# Patient Record
Sex: Female | Born: 1985 | Race: Black or African American | Hispanic: No | Marital: Married
Health system: Midwestern US, Community
[De-identification: ages and names within clinical notes are randomized; demographics above are authoritative.]

## PROBLEM LIST (undated history)

## (undated) ENCOUNTER — Inpatient Hospital Stay (HOSPITAL_COMMUNITY): Payer: Self-pay

## (undated) DIAGNOSIS — R87629 Unspecified abnormal cytological findings in specimens from vagina: Secondary | ICD-10-CM

## (undated) DIAGNOSIS — Z8619 Personal history of other infectious and parasitic diseases: Secondary | ICD-10-CM

## (undated) DIAGNOSIS — D649 Anemia, unspecified: Secondary | ICD-10-CM

## (undated) HISTORY — DX: Personal history of other infectious and parasitic diseases: Z86.19

## (undated) HISTORY — PX: DENTAL SURGERY: SHX609

## (undated) HISTORY — DX: Unspecified abnormal cytological findings in specimens from vagina: R87.629

---

## 2009-12-30 LAB — DRUG SCREEN UR - NO CONFIRM
ACETAMINOPHEN: NEGATIVE
AMPHETAMINES: NEGATIVE
BARBITURATES: NEGATIVE
BENZODIAZEPINES: NEGATIVE
COCAINE: NEGATIVE
METHADONE: NEGATIVE
Methamphetamines: NEGATIVE
OPIATES: NEGATIVE
PCP(PHENCYCLIDINE): NEGATIVE
THC (TH-CANNABINOL): NEGATIVE
TRICYCLICS: NEGATIVE

## 2009-12-30 LAB — URINALYSIS W/ RFLX MICROSCOPIC
Bilirubin: NEGATIVE
Glucose: NEGATIVE MG/DL
Ketone: NEGATIVE MG/DL
Leukocyte Esterase: NEGATIVE
Nitrites: NEGATIVE
Protein: NEGATIVE MG/DL
Specific gravity: <1.005 (ref 1.003–1.030)
Urobilinogen: 0.2 EU/DL (ref 0.2–1.0)
pH (UA): 6.5 (ref 5.0–8.0)

## 2009-12-30 LAB — METABOLIC PANEL, BASIC
Anion gap: 13 mmol/L (ref 5–15)
BUN/Creatinine ratio: 8 — ABNORMAL LOW (ref 12–20)
BUN: 6 MG/DL — ABNORMAL LOW (ref 7–18)
CO2: 23 MMOL/L (ref 21–32)
Calcium: 9.4 MG/DL (ref 8.4–10.4)
Chloride: 108 MMOL/L (ref 100–108)
Creatinine: 0.8 MG/DL (ref 0.6–1.3)
GFR est AA: >60 mL/min/{1.73_m2} (ref >60)
GFR est non-AA: >60 mL/min/{1.73_m2} (ref >60)
Glucose: 112 MG/DL — ABNORMAL HIGH (ref 74–99)
Potassium: 3.7 MMOL/L (ref 3.5–5.5)
Sodium: 144 MMOL/L (ref 136–145)

## 2009-12-30 LAB — CBC WITH AUTOMATED DIFF
ABS. BASOPHILS: 0.1 10*3/uL (ref 0.0–0.1)
ABS. EOSINOPHILS: 0 10*3/uL (ref 0.0–0.4)
ABS. LYMPHOCYTES: 1.7 10*3/uL (ref 0.9–3.6)
ABS. MONOCYTES: 0.3 10*3/uL (ref 0.05–1.2)
ABS. NEUTROPHILS: 4.7 10*3/uL (ref 1.8–8.0)
BASOPHILS: 1 % (ref 0–2)
EOSINOPHILS: 0 % (ref 0–5)
HCT: 39.9 % (ref 35.0–45.0)
HGB: 14 g/dL (ref 12.0–16.0)
LYMPHOCYTES: 25 % (ref 21–52)
MCH: 32.3 PG (ref 24.0–34.0)
MCHC: 35.1 g/dL (ref 31.0–37.0)
MCV: 92.1 FL (ref 74.0–97.0)
MONOCYTES: 5 % (ref 3–10)
MPV: 9.5 FL (ref 9.2–11.8)
NEUTROPHILS: 69 % (ref 40–73)
PLATELET: 373 10*3/uL (ref 135–420)
RBC: 4.33 M/uL (ref 4.20–5.30)
RDW: 12.8 % (ref 11.6–14.5)
WBC: 6.8 10*3/uL (ref 4.6–13.2)

## 2009-12-30 LAB — HCG URINE, QL: HCG urine, QL: NEGATIVE

## 2009-12-30 LAB — ETHYL ALCOHOL: ALCOHOL(ETHYL),SERUM: 430 MG/DL — CR (ref 0–3)

## 2009-12-30 LAB — URINE MICROSCOPIC ONLY
RBC: 4 /HPF (ref 0–5)
WBC: 0 /HPF (ref 0–4)

## 2009-12-30 LAB — ACETAMINOPHEN: Acetaminophen level: 0 ug/mL — ABNORMAL LOW (ref 10–30)

## 2009-12-30 LAB — SALICYLATE: Salicylate level: <2.8 MG/DL — ABNORMAL LOW (ref 2.8–20.0)

## 2009-12-31 LAB — GLUCOSE, POC: Glucose (POC): 115 mg/dL — ABNORMAL HIGH (ref 70–110)

## 2012-07-28 LAB — OB RESULTS CONSOLE RPR: RPR: NONREACTIVE

## 2012-07-28 LAB — OB RESULTS CONSOLE ABO/RH: RH TYPE: NEGATIVE

## 2012-07-28 LAB — OB RESULTS CONSOLE HIV ANTIBODY (ROUTINE TESTING): HIV: NONREACTIVE

## 2012-07-28 LAB — OB RESULTS CONSOLE GC/CHLAMYDIA
Chlamydia: NEGATIVE
Gonorrhea: NEGATIVE

## 2012-07-28 LAB — OB RESULTS CONSOLE RUBELLA ANTIBODY, IGM: RUBELLA: IMMUNE

## 2012-07-28 LAB — OB RESULTS CONSOLE HEPATITIS B SURFACE ANTIGEN: Hepatitis B Surface Ag: NEGATIVE

## 2012-07-28 LAB — OB RESULTS CONSOLE ANTIBODY SCREEN: Antibody Screen: NEGATIVE

## 2013-01-05 NOTE — L&D Delivery Note (Signed)
Delivery Note Pt progressed tocomplete dilation and pushed great for about 10 minutees.  At 4:42 PM a healthy female was delivered via Vaginal, Spontaneous Delivery (Presentation: Right Occiput Anterior).  APGAR: 9, 9; weight pending .   Placenta status: Intact, Spontaneous.  Cord: 3 vessels with the following complications: None.   Anesthesia: Epidural  Episiotomy: None Lacerations: Labial abrasions Suture Repair: 3.0 vicryl rapide for hemostasis Est. Blood Loss (mL): 350cc  Mom to postpartum.  Baby to Couplet care / Skin to Skin. D/w pt circumcision and they plan to proceed in office  [name] W 02/05/2013, 5:01 PM

## 2013-01-09 ENCOUNTER — Inpatient Hospital Stay (HOSPITAL_COMMUNITY)
Admission: AD | Admit: 2013-01-09 | Discharge: 2013-01-10 | Disposition: A | Discharge status: Home / Self Care | Payer: Medicaid Other | Source: Ambulatory Visit | Attending: Obstetrics and Gynecology | Admitting: Obstetrics and Gynecology

## 2013-01-09 ENCOUNTER — Encounter (HOSPITAL_COMMUNITY): Payer: Self-pay | Admitting: *Deleted

## 2013-01-09 DIAGNOSIS — O4700 False labor before 37 completed weeks of gestation, unspecified trimester: Secondary | ICD-10-CM | POA: Insufficient documentation

## 2013-01-09 DIAGNOSIS — O9989 Other specified diseases and conditions complicating pregnancy, childbirth and the puerperium: Secondary | ICD-10-CM

## 2013-01-09 DIAGNOSIS — R109 Unspecified abdominal pain: Secondary | ICD-10-CM | POA: Insufficient documentation

## 2013-01-09 DIAGNOSIS — O99891 Other specified diseases and conditions complicating pregnancy: Secondary | ICD-10-CM | POA: Insufficient documentation

## 2013-01-09 DIAGNOSIS — O26899 Other specified pregnancy related conditions, unspecified trimester: Secondary | ICD-10-CM

## 2013-01-09 NOTE — MAU Provider Note (Signed)
  History     CSN: [id number]  Arrival date and time: 01/09/13 2009   First Provider Initiated Contact with Patient 01/09/13 2246      No chief complaint on file.  HPI Ms. Chloe Clark is a 28 y.o. G1P0 at 6452w3d who presents to MAU today with sudden onset lower abdominal pain that started this evening. She states that she has cramping pain in her lower abdomen and lower back. She has had contractions that started at 1630 today and were fairly consistent until about 2130 today. She states that the contractions are less frequent now and the pain has improved. She denies vaginal bleeding, abnormal discharge, LOF, N/V or fever. She reports good fetal movement.   OB History   Grav Para Term Preterm Abortions TAB SAB Ect Mult Living   1               History reviewed. No pertinent past medical history.  Past Surgical History  Procedure Laterality Date  . Dental surgery      History reviewed. No pertinent family history.  History  Substance Use Topics  . Smoking status: Never Smoker   . Smokeless tobacco: Not on file  . Alcohol Use: No    Allergies:  Allergies  Allergen Reactions  . Percocet [Oxycodone-Acetaminophen] Itching    No prescriptions prior to admission    Review of Systems  Constitutional: Negative for fever and malaise/fatigue.  Gastrointestinal: Positive for abdominal pain. Negative for nausea, vomiting, diarrhea and constipation.  Genitourinary: Negative for dysuria, urgency and frequency.       Neg - vaginal bleeding, discharge, LOF   Physical Exam   Blood pressure 116/71, pulse 85, temperature 98.6 F (37 C), temperature source Oral, resp. rate 20, height 5\' 2"  (1.575 m), weight 155 lb 2 oz (70.364 kg).  Physical Exam  Constitutional: She is oriented to person, place, and time. She appears well-developed and well-nourished. No distress.  HENT:  Head: Normocephalic and atraumatic.  Cardiovascular: Normal rate, regular rhythm and normal heart  sounds.   Respiratory: Effort normal and breath sounds normal. No respiratory distress.  GI: Soft. Bowel sounds are normal. She exhibits no distension and no mass. There is tenderness (mild tenderness to palpation of the lower abdomen). There is no rebound and no guarding.  Neurological: She is alert and oriented to person, place, and time.  Skin: Skin is warm and dry. No erythema.  Psychiatric: She has a normal mood and affect.  Dilation:  (DIMPLE) Effacement (%): 30 Exam by:: DR BECK   MAU Course  Procedures None  MDM Discussed with Dr. Ambrose MantleHenley. Order OB US. If normal patient can be discharged to follow-up in the office as scheduled US is normal.  Upon discharge, patient asks about bilateral hand pain that is often worse at night. She denies significant swelling, numbness, tingling or loss of sensation.  + tinnels and + phalens test indicate possible mild carpal tunnel syndrome in pregnancy Patient advised that a wrist brace may be helpful for pain. Discuss with primary OB provider if symptoms persist or worsen  Assessment and Plan  A: Abdominal pain in pregnancy  P: Discharge home Labor precautions discussed Patient advised rest, abdominal binder, Tylenol PRN for pain Patient encouraged to follow-up in the office as scheduled for this week Patient may return to MAU as needed or if her condition were to change or worsen  Freddi StarrJulie N Ethier, PA-C  01/10/2013, 1:28 AM

## 2013-01-09 NOTE — MAU Note (Signed)
PT SAYS SHE STARTED   HURTING BAD AT 5 PM.   GETS [facility]-  [facility]- [facility] - [place]. SEEN LAST 2 WEEKS  AGO.  HAS AN APPOINTMENT ON WED.  DENIES HSV AND MRSA . LAST SEX-  Thursday.

## 2013-01-10 ENCOUNTER — Inpatient Hospital Stay (HOSPITAL_COMMUNITY): Payer: Medicaid Other

## 2013-01-10 NOTE — Discharge Instructions (Signed)
Abdominal Pain During Pregnancy  Belly (abdominal) pain is common during pregnancy. Most of the time, it is not a serious problem. Other times, it can be a sign that something is wrong with the pregnancy. Always tell your doctor if you have belly pain.  HOME CARE  Monitor your belly pain for any changes. The following actions may help you feel better:  · Do not have sex (intercourse) or put anything in your vagina until you feel better.  · Rest until your pain stops.  · Drink clear fluids if you feel sick to your stomach (nauseous). Do not eat solid food until you feel better.  · Only take medicine as told by your doctor.  · Keep all doctor visits as told.  GET HELP RIGHT AWAY IF:   · You are bleeding, leaking fluid, or pieces of tissue come out of your vagina.  · You have more pain or cramping.  · You keep throwing up (vomiting).  · You have pain when you pee (urinate) or have blood in your pee.  · You have a fever.  · You do not feel your baby moving as much.  · You feel very weak or feel like passing out.  · You have trouble breathing, with or without belly pain.  · You have a very bad headache and belly pain.  · You have fluid leaking from your vagina and belly pain.  · You keep having watery poop (diarrhea).  · Your belly pain does not go away after resting, or the pain gets worse.  MAKE SURE YOU:   · Understand these instructions.  · Will watch your condition.  · Will get help right away if you are not doing well or get worse.  Document Released: 12/10/2008 Document Revised: 08/24/2012 Document Reviewed: 07/21/2012  [employer]® Patient Information ©2014 [employer].

## 2013-01-11 LAB — OB RESULTS CONSOLE GBS: GBS: NEGATIVE

## 2013-02-03 ENCOUNTER — Telehealth (HOSPITAL_COMMUNITY): Payer: Self-pay | Admitting: *Deleted

## 2013-02-03 ENCOUNTER — Encounter (HOSPITAL_COMMUNITY): Payer: Self-pay | Admitting: *Deleted

## 2013-02-03 NOTE — Telephone Encounter (Signed)
Preadmission screen  

## 2013-02-05 ENCOUNTER — Inpatient Hospital Stay (HOSPITAL_COMMUNITY)
Admission: AD | Admit: 2013-02-05 | Discharge: 2013-02-07 | DRG: 775 | Disposition: A | Discharge status: Home / Self Care | Payer: Medicaid Other | Source: Ambulatory Visit | Attending: Obstetrics and Gynecology | Admitting: Obstetrics and Gynecology

## 2013-02-05 ENCOUNTER — Encounter (HOSPITAL_COMMUNITY): Payer: Self-pay

## 2013-02-05 ENCOUNTER — Encounter (HOSPITAL_COMMUNITY): Payer: Medicaid Other | Admitting: Anesthesiology

## 2013-02-05 ENCOUNTER — Inpatient Hospital Stay (HOSPITAL_COMMUNITY): Payer: Medicaid Other | Admitting: Anesthesiology

## 2013-02-05 DIAGNOSIS — O360990 Maternal care for other rhesus isoimmunization, unspecified trimester, not applicable or unspecified: Secondary | ICD-10-CM | POA: Diagnosis present

## 2013-02-05 DIAGNOSIS — D649 Anemia, unspecified: Secondary | ICD-10-CM | POA: Diagnosis present

## 2013-02-05 DIAGNOSIS — Z3490 Encounter for supervision of normal pregnancy, unspecified, unspecified trimester: Secondary | ICD-10-CM

## 2013-02-05 DIAGNOSIS — O9902 Anemia complicating childbirth: Secondary | ICD-10-CM | POA: Diagnosis present

## 2013-02-05 HISTORY — DX: Anemia, unspecified: D64.9

## 2013-02-05 LAB — CBC
HCT: 39.2 % (ref 36.0–46.0)
Hemoglobin: 14 g/dL (ref 12.0–15.0)
MCH: 32.5 pg (ref 26.0–34.0)
MCHC: 35.7 g/dL (ref 30.0–36.0)
MCV: 91 fL (ref 78.0–100.0)
PLATELETS: 253 10*3/uL (ref 150–400)
RBC: 4.31 MIL/uL (ref 3.87–5.11)
RDW: 13.2 % (ref 11.5–15.5)
WBC: 5.8 10*3/uL (ref 4.0–10.5)

## 2013-02-05 LAB — RPR: RPR: NONREACTIVE

## 2013-02-05 LAB — ABO/RH: ABO/RH(D): B NEG

## 2013-02-05 MED ORDER — PRENATAL MULTIVITAMIN CH
1.0000 | ORAL_TABLET | Freq: Every day | ORAL | Status: DC
  Administered 2013-02-06: 1 via ORAL
  Filled 2013-02-05: qty 1

## 2013-02-05 MED ORDER — OXYCODONE-ACETAMINOPHEN 5-325 MG PO TABS: 1.0000 | ORAL_TABLET | ORAL | Status: DC | PRN

## 2013-02-05 MED ORDER — DIPHENHYDRAMINE HCL 25 MG PO CAPS: 25.0000 mg | ORAL_CAPSULE | Freq: Four times a day (QID) | ORAL | Status: DC | PRN

## 2013-02-05 MED ORDER — DIPHENHYDRAMINE HCL 50 MG/ML IJ SOLN: 12.5000 mg | INTRAMUSCULAR | Status: DC | PRN

## 2013-02-05 MED ORDER — IBUPROFEN 600 MG PO TABS
600.0000 mg | ORAL_TABLET | Freq: Four times a day (QID) | ORAL | Status: DC
  Administered 2013-02-05 – 2013-02-07 (×7): 600 mg via ORAL
  Filled 2013-02-05 (×7): qty 1

## 2013-02-05 MED ORDER — PHENYLEPHRINE 40 MCG/ML (10ML) SYRINGE FOR IV PUSH (FOR BLOOD PRESSURE SUPPORT)
80.0000 ug | PREFILLED_SYRINGE | INTRAVENOUS | Status: DC | PRN
  Filled 2013-02-05: qty 2

## 2013-02-05 MED ORDER — TETANUS-DIPHTH-ACELL PERTUSSIS 5-2.5-18.5 LF-MCG/0.5 IM SUSP: 0.5000 mL | Freq: Once | INTRAMUSCULAR | Status: DC

## 2013-02-05 MED ORDER — ONDANSETRON HCL 4 MG/2ML IJ SOLN: 4.0000 mg | INTRAMUSCULAR | Status: DC | PRN

## 2013-02-05 MED ORDER — DIBUCAINE 1 % RE OINT: 1.0000 "application " | TOPICAL_OINTMENT | RECTAL | Status: DC | PRN

## 2013-02-05 MED ORDER — CITRIC ACID-SODIUM CITRATE 334-500 MG/5ML PO SOLN: 30.0000 mL | ORAL | Status: DC | PRN

## 2013-02-05 MED ORDER — LIDOCAINE HCL (PF) 1 % IJ SOLN
INTRAMUSCULAR | Status: DC | PRN
  Administered 2013-02-05 (×2): 4 mL

## 2013-02-05 MED ORDER — LIDOCAINE HCL (PF) 1 % IJ SOLN
30.0000 mL | INTRAMUSCULAR | Status: DC | PRN
  Filled 2013-02-05 (×2): qty 30

## 2013-02-05 MED ORDER — ONDANSETRON HCL 4 MG/2ML IJ SOLN
4.0000 mg | Freq: Four times a day (QID) | INTRAMUSCULAR | Status: DC | PRN
  Administered 2013-02-05: 4 mg via INTRAVENOUS
  Filled 2013-02-05: qty 2

## 2013-02-05 MED ORDER — WITCH HAZEL-GLYCERIN EX PADS: 1.0000 "application " | MEDICATED_PAD | CUTANEOUS | Status: DC | PRN

## 2013-02-05 MED ORDER — IBUPROFEN 600 MG PO TABS: 600.0000 mg | ORAL_TABLET | Freq: Four times a day (QID) | ORAL | Status: DC | PRN

## 2013-02-05 MED ORDER — FENTANYL 2.5 MCG/ML BUPIVACAINE 1/10 % EPIDURAL INFUSION (WH - ANES)
INTRAMUSCULAR | Status: DC | PRN
  Administered 2013-02-05: 14 mL/h via EPIDURAL

## 2013-02-05 MED ORDER — OXYTOCIN 40 UNITS IN LACTATED RINGERS INFUSION - SIMPLE MED
62.5000 mL/h | INTRAVENOUS | Status: DC
  Administered 2013-02-05: 62.5 mL/h via INTRAVENOUS

## 2013-02-05 MED ORDER — LACTATED RINGERS IV SOLN
INTRAVENOUS | Status: DC
  Administered 2013-02-05 (×3): via INTRAVENOUS

## 2013-02-05 MED ORDER — OXYTOCIN BOLUS FROM INFUSION: 500.0000 mL | INTRAVENOUS | Status: DC

## 2013-02-05 MED ORDER — ACETAMINOPHEN 325 MG PO TABS: 650.0000 mg | ORAL_TABLET | ORAL | Status: DC | PRN

## 2013-02-05 MED ORDER — SIMETHICONE 80 MG PO CHEW: 80.0000 mg | CHEWABLE_TABLET | ORAL | Status: DC | PRN

## 2013-02-05 MED ORDER — LACTATED RINGERS IV SOLN: 500.0000 mL | INTRAVENOUS | Status: DC | PRN

## 2013-02-05 MED ORDER — TERBUTALINE SULFATE 1 MG/ML IJ SOLN: 0.2500 mg | Freq: Once | INTRAMUSCULAR | Status: DC | PRN

## 2013-02-05 MED ORDER — SENNOSIDES-DOCUSATE SODIUM 8.6-50 MG PO TABS
2.0000 | ORAL_TABLET | ORAL | Status: DC
Start: 2013-02-06 — End: 2013-02-07
  Administered 2013-02-06 (×2): 2 via ORAL
  Filled 2013-02-05 (×2): qty 2

## 2013-02-05 MED ORDER — EPHEDRINE 5 MG/ML INJ
10.0000 mg | INTRAVENOUS | Status: DC | PRN
  Filled 2013-02-05: qty 2

## 2013-02-05 MED ORDER — LACTATED RINGERS IV SOLN
500.0000 mL | Freq: Once | INTRAVENOUS | Status: AC
  Administered 2013-02-05: 12:00:00 via INTRAVENOUS

## 2013-02-05 MED ORDER — BENZOCAINE-MENTHOL 20-0.5 % EX AERO
1.0000 "application " | INHALATION_SPRAY | CUTANEOUS | Status: DC | PRN
  Administered 2013-02-05 – 2013-02-07 (×2): 1 via TOPICAL
  Filled 2013-02-05 (×2): qty 56

## 2013-02-05 MED ORDER — PHENYLEPHRINE 40 MCG/ML (10ML) SYRINGE FOR IV PUSH (FOR BLOOD PRESSURE SUPPORT)
80.0000 ug | PREFILLED_SYRINGE | INTRAVENOUS | Status: DC | PRN
  Filled 2013-02-05: qty 10
  Filled 2013-02-05: qty 2

## 2013-02-05 MED ORDER — EPHEDRINE 5 MG/ML INJ
10.0000 mg | INTRAVENOUS | Status: DC | PRN
  Filled 2013-02-05: qty 4
  Filled 2013-02-05: qty 2

## 2013-02-05 MED ORDER — OXYTOCIN 40 UNITS IN LACTATED RINGERS INFUSION - SIMPLE MED
1.0000 m[IU]/min | INTRAVENOUS | Status: DC
  Administered 2013-02-05: 2 m[IU]/min via INTRAVENOUS
  Filled 2013-02-05: qty 1000

## 2013-02-05 MED ORDER — LANOLIN HYDROUS EX OINT: TOPICAL_OINTMENT | CUTANEOUS | Status: DC | PRN

## 2013-02-05 MED ORDER — ZOLPIDEM TARTRATE 5 MG PO TABS
5.0000 mg | ORAL_TABLET | Freq: Every evening | ORAL | Status: DC | PRN
Start: 2013-02-05 — End: 2013-02-07

## 2013-02-05 MED ORDER — BUTORPHANOL TARTRATE 1 MG/ML IJ SOLN
1.0000 mg | INTRAMUSCULAR | Status: DC | PRN
  Administered 2013-02-05: 1 mg via INTRAVENOUS
  Filled 2013-02-05: qty 1

## 2013-02-05 MED ORDER — FENTANYL 2.5 MCG/ML BUPIVACAINE 1/10 % EPIDURAL INFUSION (WH - ANES)
14.0000 mL/h | INTRAMUSCULAR | Status: DC | PRN
  Filled 2013-02-05: qty 125

## 2013-02-05 MED ORDER — ONDANSETRON HCL 4 MG PO TABS: 4.0000 mg | ORAL_TABLET | ORAL | Status: DC | PRN

## 2013-02-05 NOTE — Progress Notes (Signed)
Patient ID: [name], female   DOB: 12/04/1985, 28 y.o.   MRN: [id number] Pt admitted and progressed on her own to 4-5cm, received an epidural and is now comfortable  afeb vss FHR baseline 115-120 good variability, +accels.  Occasional mild variables  Cervix c/5+/-1 AROM blood tinged fluid  IUPC placed as contractions not tracing well. Will augment as needed

## 2013-02-05 NOTE — Anesthesia Preprocedure Evaluation (Signed)
Anesthesia Evaluation  Patient identified by MRN, date of birth, ID band Patient awake    Reviewed: Allergy & Precautions, H&P , NPO status , Patient's Chart, lab work & pertinent test results  Airway Mallampati: II TM Distance: >3 FB     Dental  (+) Dental Advisory Given   Pulmonary neg pulmonary ROS,          Cardiovascular negative cardio ROS  Rhythm:Regular     Neuro/Psych negative neurological ROS  negative psych ROS   GI/Hepatic negative GI ROS, Neg liver ROS,   Endo/Other  negative endocrine ROS  Renal/GU negative Renal ROS     Musculoskeletal negative musculoskeletal ROS (+)   Abdominal   Peds  Hematology  (+) anemia ,   Anesthesia Other Findings   Reproductive/Obstetrics (+) Pregnancy                           Anesthesia Physical Anesthesia Plan  ASA: II  Anesthesia Plan: Epidural   Post-op Pain Management:    Induction:   Airway Management Planned:   Additional Equipment:   Intra-op Plan:   Post-operative Plan:   Informed Consent: I have reviewed the patients History and Physical, chart, labs and discussed the procedure including the risks, benefits and alternatives for the proposed anesthesia with the patient or authorized representative who has indicated his/her understanding and acceptance.     Plan Discussed with:   Anesthesia Plan Comments:         Anesthesia Quick Evaluation

## 2013-02-05 NOTE — Progress Notes (Signed)
Dr. Senaida Oresichardson paged and returned call immediately. Notified of pt's ctx pattern, cervical exam, GBS negative. Dr. Senaida Oresichardson to place admission orders.

## 2013-02-05 NOTE — Anesthesia Procedure Notes (Signed)
Epidural Patient location during procedure: OB  Staffing Anesthesiologist: Aggie Douse R Performed by: anesthesiologist   Preanesthetic Checklist Completed: patient identified, pre-op evaluation, timeout performed, IV checked, risks and benefits discussed and monitors and equipment checked  Epidural Patient position: sitting Prep: site prepped and draped and DuraPrep Patient monitoring: heart rate Approach: midline Injection technique: LOR air and LOR saline  Needle:  Needle type: Tuohy  Needle gauge: 17 G Needle length: 9 cm Needle insertion depth: 5 cm Catheter type: closed end flexible Catheter size: 19 Gauge Catheter at skin depth: 11 cm Test dose: negative  Assessment Sensory level: T8 Events: blood not aspirated, injection not painful, no injection resistance, negative IV test and no paresthesia  Additional Notes Reason for block:procedure for pain   

## 2013-02-05 NOTE — MAU Note (Signed)
Pt states here for contractions q5-6 minutes apart since 0300 this am. Denies gush of fluid.

## 2013-02-05 NOTE — H&P (Signed)
Chloe Clark is a [age] y.o. female G1P0 at 5439 1/7 weeks (EDD 02/11/13 bvy LMP c/w 11 week US) presenting for painful contractions and cervical change to 100/1+/-2 with bulging bag. Preneatla care essentially uncomplicated.  Rh negative and received rhogam.  No other issues.  Maternal Medical History:  Reason for admission: Contractions.   Contractions: Onset was 3-5 hours ago.   Frequency: regular.   Perceived severity is moderate.    Fetal activity: Perceived fetal activity is normal.    Prenatal Complications - Diabetes: none.    OB History   Grav Para Term Preterm Abortions TAB SAB Ect Mult Living   1              Past Medical History  Diagnosis Date  . Vaginal Pap smear, abnormal   . H/O chlamydia infection   . Anemia    Past Surgical History  Procedure Laterality Date  . Dental surgery     Family History: family history is not on file. Social History:  reports that she has never smoked. She has never used smokeless tobacco. She reports that she does not drink alcohol or use illicit drugs.   Prenatal Transfer Tool  Maternal Diabetes: No Genetic Screening: Normal Maternal Ultrasounds/Referrals: Normal Fetal Ultrasounds or other Referrals:  None Maternal Substance Abuse:  No Significant Maternal Medications:  None Significant Maternal Lab Results:  None Other Comments:  None  Review of Systems  Gastrointestinal: Positive for abdominal pain.      Height 5\' 1"  (1.549 m), weight 70.081 kg (154 lb 8 oz). Maternal Exam:  Uterine Assessment: Contraction strength is moderate.  Contraction frequency is regular.   Abdomen: Patient reports no abdominal tenderness. Fetal presentation: vertex  Introitus: Normal vulva. Normal vagina.    Physical Exam  Constitutional: She is oriented to person, place, and time. She appears well-developed and well-nourished.  Cardiovascular: Normal rate and regular rhythm.   Respiratory: Effort normal.  GI: Soft.  Genitourinary:  Vagina normal.  Neurological: She is alert and oriented to person, place, and time.  Psychiatric: She has a normal mood and affect.    Prenatal labs: ABO, Rh: B/Negative/-- (07/24 0000) Antibody: Negative (07/24 0000) Rubella: Immune (07/24 0000) RPR: Nonreactive (07/24 0000)  HBsAg: Negative (07/24 0000)  HIV: Non-reactive (07/24 0000)  GBS: Negative (01/07 0000)  One hour GTT 83 First trimester screen and AFP negative Hgb AA   Assessment/Plan: Pt admitted in early labor for pain control.  Will AROM and augment as needed.   Oliver PilaRICHARDSON,Kaimani Clayson W 02/05/2013, 10:57 AM

## 2013-02-05 NOTE — MAU Note (Signed)
[facility], RN charge.

## 2013-02-06 LAB — CBC
HEMATOCRIT: 36.5 % (ref 36.0–46.0)
HEMOGLOBIN: 12.6 g/dL (ref 12.0–15.0)
MCH: 31.4 pg (ref 26.0–34.0)
MCHC: 34.5 g/dL (ref 30.0–36.0)
MCV: 91 fL (ref 78.0–100.0)
Platelets: 217 10*3/uL (ref 150–400)
RBC: 4.01 MIL/uL (ref 3.87–5.11)
RDW: 13.3 % (ref 11.5–15.5)
WBC: 10.6 10*3/uL — ABNORMAL HIGH (ref 4.0–10.5)

## 2013-02-06 NOTE — Progress Notes (Signed)
Post Partum Day 1 Subjective: no complaints, voiding and tolerating PO  Objective: Blood pressure 123/79, pulse 76, temperature 98.2 F (36.8 C), temperature source Oral, resp. rate 20, height 5\' 1"  (1.549 m), weight 70.081 kg (154 lb 8 oz), SpO2 99.00%, unknown if currently breastfeeding.  Physical Exam:  General: alert and cooperative Lochia: appropriate Uterine Fundus: firm   Recent Labs  02/05/13 1104 02/06/13 0543  HGB 14.0 12.6  HCT 39.2 36.5    Assessment/Plan: Plan for discharge tomorrow   LOS: 1 day   Darryle Dennie W 02/06/2013, 8:37 AM

## 2013-02-06 NOTE — Anesthesia Postprocedure Evaluation (Signed)
  Anesthesia Post-op Note  Patient: Chloe Clark  Procedure(s) Performed: * No procedures listed *  Patient Location: PACU and Mother/Baby  Anesthesia Type:Epidural  Level of Consciousness: awake, alert  and oriented  Airway and Oxygen Therapy: Patient Spontanous Breathing  Post-op Pain: none  Post-op Assessment: Post-op Vital signs reviewed, Patient's Cardiovascular Status Stable, No headache, No backache, No residual numbness and No residual motor weakness  Post-op Vital Signs: Reviewed and stable  Complications: No apparent anesthesia complications

## 2013-02-06 NOTE — Progress Notes (Signed)
UR chart review completed.  

## 2013-02-07 MED ORDER — IBUPROFEN 600 MG PO TABS: 600.0000 mg | ORAL_TABLET | Freq: Four times a day (QID) | ORAL | Status: AC

## 2013-02-07 NOTE — Lactation Note (Addendum)
This note was copied from the chart of Chloe Clark Vasco. Lactation Consultation Note Follow up consult:  Baby Chloe 2642 hours old and being discharged.  Mother breasts are filling, reviewed engorgement care, breast care, and wearing a supportive bra.  Mother states breastfeeding going well.  She attended breastfeeding classes.  Lanolin provided per patient's request.  Reviewed pp 20-24 Baby & Me booklet, feeding baby 8-12 times a day.  Encouraged mother to call for further assitance.   Patient Name: Chloe Clark Adel ZOXWR'UToday's Date: 02/07/2013 Reason for consult: Follow-up assessment   Maternal Data    Feeding Feeding Type: Breast Fed Length of feed: 20 min  LATCH Score/Interventions                      Lactation Tools Discussed/Used     Consult Status Consult Status: Complete    Hardie PulleyBerkelhammer, Chaka Jefferys Boschen 02/07/2013, 10:56 AM

## 2013-02-07 NOTE — Progress Notes (Signed)
Post Partum Day 2 Subjective: no complaints and tolerating PO  Objective: Blood pressure 133/80, pulse 68, temperature 98.4 F (36.9 C), temperature source Oral, resp. rate 20, height 5\' 1"  (1.549 m), weight 70.081 kg (154 lb 8 oz), SpO2 99.00%, unknown if currently breastfeeding.  Physical Exam:  General: alert and cooperative Lochia: appropriate Uterine Fundus: firm    Recent Labs  02/05/13 1104 02/06/13 0543  HGB 14.0 12.6  HCT 39.2 36.5    Assessment/Plan: Discharge home   LOS: 2 days   Zabrina Brotherton W 02/07/2013, 8:43 AM

## 2013-02-07 NOTE — Discharge Summary (Signed)
Obstetric Discharge Summary Reason for Admission: onset of labor Prenatal Procedures: none Intrapartum Procedures: spontaneous vaginal delivery Postpartum Procedures: none Complications-Operative and Postpartum: bilateral labial abrasions Hemoglobin  Date Value Range Status  02/06/2013 12.6  12.0 - 15.0 g/dL Final     HCT  Date Value Range Status  02/06/2013 36.5  36.0 - 46.0 % Final    Physical Exam:  General: alert and cooperative Lochia: appropriate Uterine Fundus: firm   Discharge Diagnoses: Term Pregnancy-delivered  Discharge Information: Date: 02/07/2013 Activity: pelvic rest Diet: routine Medications: Ibuprofen Condition: improved Instructions: refer to practice specific booklet Discharge to: home Follow-up Information   [facility] 02/08/2013. (@  0915)    Contact information:   [phone]      Follow up with Oliver PilaICHARDSON,Patches Mcdonnell W, MD In 6 weeks. (posatpartum)    Specialty:  Obstetrics and Gynecology   Contact information:   [address], [address] [place] [address] [phone]       Newborn Data: Live born female  Birth Weight: 7 lb 13.8 oz (3566 g) APGAR: 9, 9  Home with mother.  Oliver PilaRICHARDSON,Vayda Dungee W 02/07/2013, 8:44 AM

## 2013-02-15 ENCOUNTER — Inpatient Hospital Stay (HOSPITAL_COMMUNITY): Admission: RE | Admit: 2013-02-15 | Payer: Medicaid Other | Source: Ambulatory Visit

## 2013-03-03 ENCOUNTER — Inpatient Hospital Stay (HOSPITAL_COMMUNITY)
Admission: AD | Admit: 2013-03-03 | Discharge: 2013-03-03 | Disposition: A | Discharge status: Home / Self Care | Payer: Medicaid Other | Source: Ambulatory Visit | Attending: Obstetrics and Gynecology | Admitting: Obstetrics and Gynecology

## 2013-03-03 ENCOUNTER — Encounter (HOSPITAL_COMMUNITY): Payer: Self-pay

## 2013-03-03 DIAGNOSIS — N949 Unspecified condition associated with female genital organs and menstrual cycle: Secondary | ICD-10-CM | POA: Insufficient documentation

## 2013-03-03 DIAGNOSIS — N764 Abscess of vulva: Secondary | ICD-10-CM | POA: Insufficient documentation

## 2013-03-03 DIAGNOSIS — R110 Nausea: Secondary | ICD-10-CM | POA: Insufficient documentation

## 2013-03-03 LAB — URINALYSIS, ROUTINE W REFLEX MICROSCOPIC
Bilirubin Urine: NEGATIVE
Glucose, UA: NEGATIVE mg/dL
Ketones, ur: 15 mg/dL — AB
Nitrite: NEGATIVE
PH: 6.5 (ref 5.0–8.0)
PROTEIN: 30 mg/dL — AB
Specific Gravity, Urine: >1.03 — ABNORMAL HIGH (ref 1.005–1.030)
Urobilinogen, UA: 0.2 mg/dL (ref 0.0–1.0)

## 2013-03-03 LAB — POCT PREGNANCY, URINE: PREG TEST UR: NEGATIVE

## 2013-03-03 LAB — URINE MICROSCOPIC-ADD ON

## 2013-03-03 MED ORDER — LIDOCAINE HCL 2 % EX GEL
Freq: Once | CUTANEOUS | Status: DC
  Filled 2013-03-03: qty 5

## 2013-03-03 MED ORDER — LIDOCAINE HCL (PF) 1 % IJ SOLN
5.0000 mL | Freq: Once | INTRAMUSCULAR | Status: DC
  Filled 2013-03-03: qty 30

## 2013-03-03 MED ORDER — CEPHALEXIN 500 MG PO CAPS: 500.0000 mg | ORAL_CAPSULE | Freq: Three times a day (TID) | ORAL | Status: AC

## 2013-03-03 NOTE — MAU Provider Note (Signed)
CSN: [id number]     Arrival date & time 03/03/13  0935 History   None    Chief Complaint  Patient presents with  . Vaginal Pain  . Abscess     (Consider location/radiation/quality/duration/timing/severity/associated sxs/prior Treatment) Patient is a 28 y.o. female presenting with vaginal pain and abscess. The history is provided by the patient.  Vaginal Pain This is a new problem. The current episode started in the past 7 days. The problem occurs constantly. The problem has been gradually worsening. Associated symptoms include nausea. Pertinent negatives include no abdominal pain, anorexia, chest pain, chills, congestion, coughing, fever, sore throat or vomiting. The symptoms are aggravated by walking (sitting). She has tried heat for the symptoms.  Abscess  Pertinent negatives include no anorexia, no fever, no vomiting, no congestion, no sore throat and no cough.   Chloe Clark is a 28 y.o. female s/p delivery 4 weeks ago. She states that she started having pain in the vaginal area 5 days ago and it has gotten progressively worse.  Past Medical History  Diagnosis Date  . Vaginal Pap smear, abnormal   . H/O chlamydia infection   . Anemia    Past Surgical History  Procedure Laterality Date  . Dental surgery     History reviewed. No pertinent family history. History  Substance Use Topics  . Smoking status: Never Smoker   . Smokeless tobacco: Never Used  . Alcohol Use: No   OB History   Grav Para Term Preterm Abortions TAB SAB Ect Mult Living   1 1 1       1      Review of Systems  Constitutional: Negative for fever and chills.  HENT: Negative for congestion and sore throat.   Respiratory: Negative for cough.   Cardiovascular: Negative for chest pain.  Gastrointestinal: Positive for nausea. Negative for vomiting, abdominal pain and anorexia.  Genitourinary: Positive for vaginal pain. Negative for dysuria, urgency and frequency.  Skin: Positive for wound.  Neurological:  Negative for syncope.  Psychiatric/Behavioral: The patient is not nervous/anxious.       Allergies  Percocet  Home Medications  No current outpatient prescriptions on file. BP 138/100  Pulse 67  Temp(Src) 98.1 F (36.7 C) (Oral)  Resp 16  Ht 5\' 1"  (1.549 m)  Wt 129 lb 3.2 oz (58.605 kg)  BMI 24.42 kg/m2  SpO2 100% Physical Exam  Nursing note and vitals reviewed. Constitutional: She is oriented to person, place, and time. She appears well-developed and well-nourished.  HENT:  Head: Normocephalic.  Eyes: Conjunctivae and EOM are normal.  Neck: Neck supple.  Cardiovascular: Normal rate.   Pulmonary/Chest: Effort normal.  Genitourinary:     External genitalia with raised tender area with erythema right labia.   Musculoskeletal: Normal range of motion.  Neurological: She is alert and oriented to person, place, and time. No cranial nerve deficit.  Skin: Skin is warm and dry.  Psychiatric: She has a normal mood and affect. Her behavior is normal.    ED Course  Procedures  Abscess Body location: right labia  Consent form signed. Time out.   Patient positioned and draped with sterile towels.  Preoperative medication: Lidocaine gel applied to the area.   Area cleaned with betadine Local infiltrate with lidocaine 1%. Amount 2 ccs  I&D Scalpel size: #11blade Incision type: Straight single  Complexity: Complex Drained moderate amount of purulent drainage Probed with curved hemostat to break up loculations  Irrigated with NSS Packing: none Patient tolerance: Tolerated procedure well.  MDM: Discussed with Dr. Senaida Oresichardson.  28 y.o. female with right labial abscess. After drained patient feeling better. Stable for discharge. She will continue to use sitz baths and start antibiotics. She will follow up in the office. She will return here as needed for problems. BP elevated on admission but has improved after I&D.  BP 146/84  Pulse 70  Temp(Src) 98.1 F (36.7 C)  (Oral)  Resp 16  Ht 5\' 1"  (1.549 m)  Wt 129 lb 3.2 oz (58.605 kg)  BMI 24.42 kg/m2  SpO2 100%  Discussed with the patient and all questioned fully answered.

## 2013-03-03 NOTE — MAU Note (Signed)
Patient states she had a baby one month ago. States she has a ?boil on the right labia since Monday, getting bigger and painful. Has slight nausea. Slight discharge with a funny smell.

## 2013-11-06 ENCOUNTER — Encounter (HOSPITAL_COMMUNITY): Payer: Self-pay

## 2015-10-24 DIAGNOSIS — R040 Epistaxis: Secondary | ICD-10-CM

## 2015-10-24 NOTE — ED Triage Notes (Signed)
Nose bleeding 3 times today headache congestion going on today

## 2015-10-25 ENCOUNTER — Inpatient Hospital Stay
Admit: 2015-10-25 | Discharge: 2015-10-25 | Disposition: A | Discharge status: Home / Self Care | Payer: BLUE CROSS/BLUE SHIELD | Attending: Emergency Medicine

## 2015-10-25 MED ORDER — FLUTICASONE 50 MCG/ACTUATION NASAL SPRAY, SUSP
50 mcg/actuation | Freq: Every day | NASAL | 0 refills | Status: AC
Start: 2015-10-25 — End: ?

## 2015-10-25 NOTE — ED Provider Notes (Signed)
[facility]  Emergency Department Treatment Report    Patient: Theresa Price Age: 30 y.o. Sex: female    Date of Birth: 03/15/1985 Admit Date: 10/24/2015 PCP: Mendel Corningonald W Atwood, MD   MRN: [id number]  CSN: [id number]     Room: ER02/ER02 Time Dictated: 12:11 AM        Chief Complaint   Nosebleed  History of Present Illness   30 y.o. female states that she's had nosebleeds several times today. Mostly from the right side of her nose. States she feels pressure in her face. Denies fevers, denies totally breathing.    Review of Systems   Constitutional: No fever, chills, or weight loss  Eyes: No visual sym No sore throatptoms.  ENT:, Positive for runny nose and nosebleed  Respiratory:  No cough, dyspnea or wheezing.  Cardiovascular: No chest pain, pressure, palpitations, tightness or heaviness.    Denies complaints in all other systems.    Past Medical/Surgical History   Patient denies any past medical history    Social History     Social History     Social History   ??? Marital status: MARRIED     Spouse name: N/A   ??? Number of children: N/A   ??? Years of education: N/A     Social History Main Topics   ??? Smoking status: Never Smoker   ??? Smokeless tobacco: Never Used   ??? Alcohol use Yes   ??? Drug use: No   ??? Sexual activity: Not Asked     Other Topics Concern   ??? None     Social History Narrative       Family History   Hypertension    Current Medications     Prior to Admission medications    Medication Sig Start Date End Date Taking? Authorizing Provider   fluticasone (FLONASE) 50 mcg/actuation nasal spray 2 Sprays by Both Nostrils route daily. 10/25/15  Yes Posey Prontoobert E Lamae Fosco, MD     Allergies     Allergies   Allergen Reactions   ??? Percocet [Oxycodone-Acetaminophen] Rash     Physical Exam   ED Triage Vitals   Enc Vitals Group      BP 10/24/15 2229 156/104      Pulse (Heart Rate) 10/24/15 2229 83      Resp Rate 10/24/15 2229 20      Temp 10/24/15 2229 98.2 ??F (36.8 ??C)      Temp src --        O2 Sat (%) 10/24/15 2229 100 %      Weight 10/24/15 2223 120 lb      Height 10/24/15 2223 5\' 1"       Head Cir --       Peak Flow --       Pain Score --       Pain Loc --       Pain Edu? --       Excl. in GC? --      Constitutional Pleasant young lady alert and oriented.  HEENT: Conjunctiva clear.  PERRLA. Mucous membranes moist, non-erythematous. Surface of the pharynx, palate, and tongue are pink, moist and without lesions.Tympanic membranes are clear bilaterally nasal mucosa is boggy and indurated appearing. There is an area at [facility] plex[facility] on the right side which was bleeding previously but is not bleeding now.   Neck: supple, non tender, symmetrical, no masses or JVD.   Respiratory: lungs clear to auscultation, nonlabored respirations. No tachypnea or accessory  muscle use.  Cardiovascular: heart regular rate and rhythm without murmur rubs or gallops.   Calves soft and non-tender.  No peripheral edema or significant variscosities.    Gastrointestinal:  Abdomen soft, nontender without complaint of pain to palpation              Impression and Management Plan   This is a new problem for this patient.  Diagnostic Studies   Lab:   No results found for this or any previous visit (from the past 12 hour(s)).    Imaging:    No results found.      ED Course/Medical Decision Making   No epistaxis at this time. Patient appears to have allergic rhinitis. We will treat with Flonase, I advised her to hold her nose for 30 minutes if it starts bleeding again.    Medications - No data to display  Final Diagnosis       ICD-10-CM ICD-9-CM   1. Epistaxis R04.0 784.7   2. Acute allergic rhinitis due to other allergen, unspecified seasonality J30.89 477.8     Disposition:   Discharged in stable condition with prescription for Flonase   Follow-up with primary care in 2-3 days for a recheck.    Current Discharge Medication List      START taking these medications    Details    fluticasone (FLONASE) 50 mcg/actuation nasal spray 2 Sprays by Both Nostrils route daily.  Qty: 1 Bottle, Refills: 0           Posey Pronto, M.D. [facility]  October 25, 2015    My signature above authenticates this document and my orders, the final ??  diagnosis (es), discharge prescription (s), and instructions in the Epic ??  record.  If you have any questions please contact [phone].  ??  Nursing notes have been reviewed by the physician/ advanced practice ??  Clinician.    This chart was dictated using Conservation officer, historic buildings. Inadvertent errors may be present.

## 2015-10-25 NOTE — ED Notes (Signed)
12:13 AM  10/25/15     Discharge instructions given to patient (name) with verbalization of understanding. Patient accompanied by self.  Patient discharged with the following prescriptions Flonase. Patient discharged to home (destination).      Kerin SalenLindsay L Heck, RN

## 2021-03-18 ENCOUNTER — Inpatient Hospital Stay
Admit: 2021-03-18 | Discharge: 2021-03-18 | Disposition: A | Discharge status: Home / Self Care | Payer: MEDICAID | Attending: Emergency Medicine

## 2021-03-18 DIAGNOSIS — R040 Epistaxis: Secondary | ICD-10-CM

## 2021-03-18 DIAGNOSIS — G44209 Tension-type headache, unspecified, not intractable: Secondary | ICD-10-CM

## 2021-03-18 MED ORDER — METOCLOPRAMIDE HCL 10 MG PO TABS
10 MG | ORAL_TABLET | Freq: Four times a day (QID) | ORAL | 3 refills | Status: AC | PRN
Start: 2021-03-18 — End: ?

## 2021-03-18 MED ORDER — CHLORPHENIRAMINE-PHENYLEPHRINE 4-10 MG PO TABS
4-10 MG | ORAL_TABLET | Freq: Three times a day (TID) | ORAL | 0 refills | Status: AC | PRN
Start: 2021-03-18 — End: 2021-12-10

## 2021-03-18 MED ORDER — OXYMETAZOLINE HCL 0.05 % NA SOLN
0.05 % | Freq: Two times a day (BID) | NASAL | 3 refills | Status: AC
Start: 2021-03-18 — End: 2021-03-21

## 2021-03-18 MED ORDER — BUTALBITAL-APAP-CAFFEINE 50-325-40 MG PO TABS
50-325-40 MG | ORAL_TABLET | Freq: Four times a day (QID) | ORAL | 0 refills | Status: AC | PRN
Start: 2021-03-18 — End: ?

## 2021-03-18 NOTE — ED Triage Notes (Signed)
Pt reports she has been getting frequent nose bleeds for the past month, past couple days it has been few times a day.  Today she had a nose bleed that lasted an hour.  PT also c/o Ha and body aches for the past week.    Denies taking any blood thinners

## 2021-03-18 NOTE — ED Provider Notes (Signed)
EMERGENCY DEPARTMENT HISTORY AND PHYSICAL EXAM      Date: 03/18/2021  Patient Name: Alejandra Carpenter    History of Presenting Illness     Chief Complaint   Patient presents with    Headache    Generalized Body Aches       History Provided By: History provided by: Patient    Chief Complaint: Nosebleeds, headache    Additional History (Context): Alejandra Carpenter is a 36 y.o. female who presents with nosebleeds and headaches.  Patient notes that for the last 2 months she has had on and off nosebleeds, initially was related to when she had URI symptoms with runny nose and congestion and mild nonproductive cough, however that is since abated but she still is having on and off nosebleeds mainly from her right nostril.  She has been trying nasal saline and Vaseline at home, as well as Flonase, however having persistent nosebleeds.  Denies any bruising or bleeding at other locations, denies any anticoagulant use.    PCP: Durward Mallard ATWOOD, MD    No current facility-administered medications for this encounter.     Current Outpatient Medications   Medication Sig Dispense Refill    oxymetazoline (12 HOUR NASAL SPRAY) 0.05 % nasal spray 2 sprays by Nasal route 2 times daily for 3 days 1 each 3    Chlorpheniramine-Phenylephrine 4-10 MG TABS Take 1 tablet by mouth every 8 hours as needed (nasal congestion and/or runny nose) 24 tablet 0    metoclopramide (REGLAN) 10 MG tablet Take 1 tablet by mouth 4 times daily as needed (nausea and/or headache) 20 tablet 3    butalbital-acetaminophen-caffeine (FIORICET, ESGIC) 50-325-40 MG per tablet Take 1 tablet by mouth every 6 hours as needed for Headaches Max Daily Amount: 4 tablets 20 tablet 0       Past History     Past Medical History:  No past medical history on file.    Past Surgical History:  No past surgical history on file.    Family History:  No family history on file.    Social History:       Allergies:  Allergies   Allergen Reactions    Oxycodone-Acetaminophen Rash          Review of Systems   Review of Systems   Constitutional:  Negative for activity change, appetite change, diaphoresis, fatigue and fever.   HENT:  Positive for nosebleeds. Negative for congestion, ear pain, mouth sores, sore throat and trouble swallowing.    Eyes:  Negative for photophobia, pain, discharge, redness, itching and visual disturbance.   Respiratory:  Negative for cough, chest tightness, shortness of breath and wheezing.    Cardiovascular:  Negative for chest pain, palpitations and leg swelling.   Gastrointestinal:  Negative for abdominal distention, abdominal pain, constipation, diarrhea, nausea and vomiting.   Endocrine: Negative for polydipsia, polyphagia and polyuria.   Genitourinary:  Negative for enuresis, flank pain, frequency and hematuria.   Musculoskeletal:  Negative for arthralgias, back pain, joint swelling, myalgias, neck pain and neck stiffness.   Skin:  Negative for pallor, rash and wound.   Neurological:  Negative for dizziness, tremors, seizures, facial asymmetry, weakness, light-headedness, numbness and headaches.   Hematological:  Does not bruise/bleed easily.   Psychiatric/Behavioral:  Negative for agitation, confusion, dysphoric mood, hallucinations, self-injury, sleep disturbance and suicidal ideas.    All other systems reviewed and are negative.    Physical Exam     ED Triage Vitals [03/18/21 1435]   BP  Temp Temp Source Heart Rate Resp SpO2 Height Weight   (!) 152/98 98.4 ??F (36.9 ??C) Oral (!) 105 17 99 % 1.549 m 59 kg      Physical Exam  Vitals and nursing note reviewed.   Constitutional:       General: She is not in acute distress.     Appearance: She is well-developed and normal weight. She is not ill-appearing.   HENT:      Head: Normocephalic and atraumatic.      Nose: No congestion or rhinorrhea.      Comments: Has a superficial excoriation in the right nostril, nasal septum.     Mouth/Throat:      Mouth: Mucous membranes are moist.      Pharynx: Oropharynx is clear.    Eyes:      Extraocular Movements: Extraocular movements intact.      Pupils: Pupils are equal, round, and reactive to light.   Cardiovascular:      Rate and Rhythm: Normal rate and regular rhythm.      Heart sounds: Normal heart sounds. No murmur heard.  Pulmonary:      Effort: Pulmonary effort is normal.      Breath sounds: Normal breath sounds. No wheezing.   Musculoskeletal:         General: Normal range of motion.      Right lower leg: No edema.      Left lower leg: No edema.   Skin:     General: Skin is warm and dry.      Capillary Refill: Capillary refill takes less than 2 seconds.      Findings: No bruising or rash.   Neurological:      General: No focal deficit present.      Mental Status: She is alert.   Psychiatric:         Behavior: Behavior normal.           Diagnostic Study Results     Labs -   No results found for this or any previous visit (from the past 12 hour(s)).    Radiologic Studies -   No orders to display          Medical Decision Making   I am the first provider for this patient.    I reviewed the vital signs, available nursing notes, past medical history, past surgical history, family history and social history.    Vital Signs-Reviewed the patient's vital signs.    Records Reviewed: Nursing notes    ED Course:      Remained stable during her emergency department stay    Other considerations:     Threat to body function without evaluation and management: Uncontrolled bleeding can be life-threatening    I considered the following testing, treatment, or disposition:   Labs to include CBC and BMP and coagulation factors, but decided not to pursue due to patient does not have bruising or bleeding at other sites, nor she taking any anticoagulants, but not think that she has a bleeding diathesis as the cause of her nosebleeds    Brief differential diagnosis includes epistaxis, sinusitis, URI, bleeding disorder, and a host of other acute ENT and hematologic  pathologies.    Disposition:  Discharge    DISCHARGE NOTE:     Pt has been reexamined. Patient has no new complaints, changes, or physical findings.  Care plan outlined and precautions discussed.  All medications were reviewed with the patient; will d/c home with continuation  of Vaseline and nasal saline and Flonase, addition of Afrin and chlorpheniramine plus phenylephrine for her sinus pain and pressure, and Reglan and Fioricet for her headaches. All of pt's questions and concerns were addressed. Patient was instructed and agrees to follow up with primary care and ENT, as well as to return to the ED upon further deterioration. Patient is ready to go home.    Follow-up:  Mendel Corningonald W Atwood, MD  [address]  [address]  [place] [id number]  [phone]    Call in 2 days  As needed, If symptoms worsen    Jennette DubinKimberly Pasquale, MD  [address]  [address]  [place] [phone]  [phone]    Schedule an appointment as soon as possible for a visit in 1 week      [facility]  [address] [place]  [place] [id number]  [phone]    As needed, If symptoms worsen           Medication List        START taking these medications      butalbital-acetaminophen-caffeine 50-325-40 MG per tablet  Commonly known as: FIORICET, ESGIC  Take 1 tablet by mouth every 6 hours as needed for Headaches Max Daily Amount: 4 tablets     Chlorpheniramine-Phenylephrine 4-10 MG Tabs  Take 1 tablet by mouth every 8 hours as needed (nasal congestion and/or runny nose)     metoclopramide 10 MG tablet  Commonly known as: Reglan  Take 1 tablet by mouth 4 times daily as needed (nausea and/or headache)     oxymetazoline 0.05 % nasal spray  Commonly known as: 12 Hour Nasal Spray  2 sprays by Nasal route 2 times daily for 3 days               Where to Get Your Medications        These medications were sent to [facility] [place], [place] - [address] [place] [phone] Carmon Ginsberg- F [phone]  [address], [place] [phone]      Phone: [phone]   butalbital-acetaminophen-caffeine 50-325-40 MG per tablet  Chlorpheniramine-Phenylephrine 4-10 MG Tabs  metoclopramide 10 MG tablet  oxymetazoline 0.05 % nasal spray           Medical Decision Making   Recurrent nosebleeds, no evidence of coagulopathy or current active bleed that would require immediate intervention.  Continue with nasal saline and nasal Vaseline and Flonase as she is already using, add on Afrin and chlorpheniramine plus phenylephrine for sinus pain and pressure, as well as Reglan and Fioricet for her headaches.  Outpatient ENT and primary care follow-up.      Diagnosis     Clinical Impression:     ICD-10-CM    1. Epistaxis  R04.0       2. Acute non intractable tension-type headache  G44.209 butalbital-acetaminophen-caffeine (FIORICET, ESGIC) 50-325-40 MG per tablet                  Jaynie CollinsJames K Allen Egerton, MD  03/18/21 [phone]

## 2021-03-18 NOTE — Discharge Instructions (Addendum)
Continue flonase, vaseline, and saline nasal spray as you have been doing.

## 2021-03-18 NOTE — ED Notes (Signed)
Pt seen, treated and discharged by provider.      Geanie Logan, RN  03/18/21 [phone]

## 2021-12-10 ENCOUNTER — Inpatient Hospital Stay
Admit: 2021-12-10 | Discharge: 2021-12-10 | Disposition: A | Discharge status: Home / Self Care | Payer: MEDICAID | Attending: Emergency Medicine

## 2021-12-10 DIAGNOSIS — L03116 Cellulitis of left lower limb: Secondary | ICD-10-CM

## 2021-12-10 MED ORDER — TETANUS-DIPHTH-ACELL PERTUSSIS 5-2.5-18.5 LF-MCG/0.5 IM SUSY
Freq: Once | INTRAMUSCULAR | Status: AC
Start: 2021-12-10 — End: 2021-12-10
  Administered 2021-12-10: 19:00:00 0.5 mL via INTRAMUSCULAR

## 2021-12-10 MED ORDER — FAMOTIDINE 20 MG PO TABS
20 MG | ORAL_TABLET | Freq: Two times a day (BID) | ORAL | 3 refills | Status: AC
Start: 2021-12-10 — End: ?

## 2021-12-10 MED ORDER — SULFAMETHOXAZOLE-TRIMETHOPRIM 800-160 MG PO TABS
800-160 MG | ORAL_TABLET | Freq: Two times a day (BID) | ORAL | 0 refills | Status: AC
Start: 2021-12-10 — End: 2021-12-20

## 2021-12-10 MED ORDER — HYDROXYZINE HCL 25 MG PO TABS
25 MG | ORAL_TABLET | Freq: Three times a day (TID) | ORAL | 0 refills | Status: AC | PRN
Start: 2021-12-10 — End: 2021-12-20

## 2021-12-10 MED ORDER — MUPIROCIN 2 % EX OINT
2 % | CUTANEOUS | 0 refills | Status: AC
Start: 2021-12-10 — End: 2021-12-17

## 2021-12-10 MED FILL — BOOSTRIX 5-2.5-18.5 LF-MCG/0.5 IM SUSY: INTRAMUSCULAR | Qty: 0.5

## 2021-12-10 NOTE — ED Provider Notes (Signed)
[facility]  Emergency Department Treatment Report        Patient: Alejandra Carpenter Age: 36 y.o. Sex: female    Date of Birth: 03-31-1985 Admit Date: 12/10/2021 PCP: Mendel Corning, MD   MRN: [id number]  CSN: [id number]     Room: 111/EO11 Time Dictated: 1:26 PM      Attending MD: Smitty Cords, MD   APC: Lavone Nian, PA-C    Chief Complaint   Chief Complaint   Patient presents with    Leg Pain       History of Present Illness   36 y.o. female who presents emergency department with complaints of pain and swelling with a knot over the anterior portion of her lower leg that began last night.  She states that she thought she had a fever last night but did not take her temperature.  She describes clear to white discharge that was expressed out of it.  She states that her husband had similar rash and lesion that required ED evaluation in the hospital yesterday.  She is unsure of her last tetanus vaccination.    Review of Systems   Review of Systems   Constitutional:  Positive for fever.   Respiratory:  Negative for cough and shortness of breath.    Cardiovascular:  Negative for chest pain.   Gastrointestinal:  Negative for abdominal pain.   Genitourinary:  Negative for dysuria.   Musculoskeletal:  Negative for back pain.   Skin:         Area of inflammation, erythema and blistering on left lower leg.   Neurological:  Negative for light-headedness and headaches.       Past Medical/Surgical History   History reviewed. No pertinent past medical history.  History reviewed. No pertinent surgical history.     Social History     Social History     Socioeconomic History    Marital status: Married     Spouse name: Not on file    Number of children: Not on file    Years of education: Not on file    Highest education level: Not on file   Occupational History    Not on file   Tobacco Use    Smoking status: Not on file    Smokeless tobacco: Not on file   Substance and Sexual Activity    Alcohol use: Not on file     Drug use: Not on file    Sexual activity: Not on file   Other Topics Concern    Not on file   Social History Narrative    Not on file     Social Determinants of Health     Financial Resource Strain: Not on file   Food Insecurity: Not on file   Transportation Needs: Not on file   Physical Activity: Not on file   Stress: Not on file   Social Connections: Not on file   Intimate Partner Violence: Not on file   Housing Stability: Not on file        Family History   History reviewed. No pertinent family history.     Current Medications     Previous Medications    BUTALBITAL-ACETAMINOPHEN-CAFFEINE (FIORICET, ESGIC) 50-325-40 MG PER TABLET    Take 1 tablet by mouth every 6 hours as needed for Headaches Max Daily Amount: 4 tablets    CHLORPHENIRAMINE-PHENYLEPHRINE 4-10 MG TABS    Take 1 tablet by mouth every 8 hours as needed (nasal congestion and/or  runny nose)    METOCLOPRAMIDE (REGLAN) 10 MG TABLET    Take 1 tablet by mouth 4 times daily as needed (nausea and/or headache)      Allergies     Allergies   Allergen Reactions    Oxycodone-Acetaminophen Rash      Physical Exam     ED Triage Vitals [12/10/21 1117]   BP Temp Temp Source Pulse Respirations SpO2 Height Weight - Scale   127/88 97.9 F (36.6 C) Oral 99 16 97 % 1.549 m (5\' 1" ) 56.7 kg (125 lb)     Physical Exam  Vitals and nursing note reviewed.   Cardiovascular:      Rate and Rhythm: Normal rate and regular rhythm.      Pulses: Normal pulses.   Pulmonary:      Effort: Pulmonary effort is normal.   Abdominal:      Tenderness: There is no abdominal tenderness.   Musculoskeletal:      Cervical back: Normal range of motion.   Skin:     Comments: Patient with mild erythema edema of the anterior distal lower tib-fib with tenderness to palpation, central blistering and central pustule.  No significant induration or fluctuance.  Distal pulses and sensation intact.  No calf or thigh pain or tenderness to palpation.   Neurological:      General: No focal deficit present.       Mental Status: She is alert.   Psychiatric:         Mood and Affect: Mood normal.         Behavior: Behavior normal.         Thought Content: Thought content normal.                Impression and Management Plan      Will update patient's tetanus.  Do not believe that she has an abscess.  Do not believe that she is in need of imaging or blood work at this time.  Procedures    Diagnostic Studies   Lab:   No results found for this or any previous visit (from the past 12 hour(s)).  Labs Reviewed - No data to display       No orders to display      ED Course        Medications - No data to display         Medical Decision Making     36 y.o. female with left lower extremity swelling and pain with vesicle consistent with inflammatory process, could be infectious versus allergic.  Recommending topical and oral antibiotics in addition to type I and type II antihistamines.  Have updated patient's tetanus.  Discussed with her warm compresses and elevation.  Will discharge patient home with these prescriptions and recommend close primary care follow-up this week.  She has been counseled return to ED if no improvement in 2 days or worsening symptoms at any time.  Patient verbalized understanding is comfortable with discharge to home.  Final Diagnosis     1. Cellulitis of left lower extremity            Disposition   Discharge to home    The patient was personally evaluated by myself and discussed with Kisa, Pete Pelt, MD   who agrees with the above assessment and plan.    Harless Nakayama, PA-C  December 10, 2021    My signature above authenticates this document and my orders, the final    diagnosis (es),  discharge prescription (s), and instructions in the Epic    record.  If you have any questions please contact [phone].     Nursing notes have been reviewed by the physician/ advanced practice    Clinician.    Dragon medical dictation software was used for portions of this report. Unintended voice recognition errors may  occur.       Hilma Favors, [place]  12/10/21 1352

## 2021-12-10 NOTE — ED Notes (Signed)
Pt was discharged by PA.     Pt ambulatory out of department.        Barbaraann Barthel, LPN  74/12/87 [phone]

## 2021-12-10 NOTE — Discharge Instructions (Signed)
Take full course of antibiotics as prescribed.   Antibiotic ointment as prescribed.   Hydroxyzine and PEPcid for histamine response.   Call PCP for followup appointment within next 2-3 days.   Return to the ED if increased pain, swelling, new or worsening symptoms.

## 2021-12-10 NOTE — ED Triage Notes (Signed)
Ambulatory to triage, gait steady. States pain in L leg. Reports a knot popped up to her lower leg last PM. Reports pain and tender to touch. States she thinks she had a fever last night but afebrile today. She also states "knot started draining some last night".

## 2021-12-10 NOTE — Progress Notes (Signed)
Rounded on pt in triage. No concerns or needs expressed at this time.

## 2021-12-10 NOTE — ED Notes (Signed)
Pt ambulatory from triage to vertical care with steady gait       Barbaraann Barthel, LPN  41/28/78 [phone]

## 2021-12-30 DIAGNOSIS — L03116 Cellulitis of left lower limb: Secondary | ICD-10-CM

## 2021-12-30 DIAGNOSIS — S81802A Unspecified open wound, left lower leg, initial encounter: Secondary | ICD-10-CM

## 2021-12-30 NOTE — ED Triage Notes (Signed)
Pt has wound on L shin x 3 weeks from spider bite.  Pt reports she was here 3 weeks ago, concern for infection

## 2021-12-30 NOTE — ED Provider Notes (Signed)
[facility]  Emergency [facility] Treatment Report        Patient: Stephens ShireLatonya L Capriotti Age: 36 y.o. Sex: female    Date of Birth: 06/14/1985 Admit Date: 12/30/2021 PCP: Mendel CorningAtwood, Ronald W, MD   MRN: [id number]  CSN: [id number]  No att. providers found   Room: OTF/OTF Time Dictated: 2:51 AM [place]       Chief Complaint   Chief Complaint   Patient presents with    Wound Check       History of Present Illness   This is a 36 y.o. female presents to the ED for left leg wound.  Patient states that she was seen in the ED 3 weeks ago for spider bite and given antibiotics.  She states since finishing course of antibiotics she has had increased pain, swelling and "black stuff" in wound.  She states that her daughter kicked her left shin couple days ago and pain has worsened since.  She reports sharp achy pain with purulent drainage and odor.  She has been taking ibuprofen and Tylenol at home with no relief of symptoms.  She denies any fevers or chills or shortness of breath or nausea or vomiting or chest pains or palpitations.    Review of Systems   Review of Systems  As per HPI    Past Medical/Surgical History   No past medical history on file.  No past surgical history on file.    Social History     Social History     Socioeconomic History    Marital status: Married     Spouse name: Not on file    Number of children: Not on file    Years of education: Not on file    Highest education level: Not on file   Occupational History    Not on file   Tobacco Use    Smoking status: Not on file    Smokeless tobacco: Not on file   Substance and Sexual Activity    Alcohol use: Not on file    Drug use: Not on file    Sexual activity: Not on file   Other Topics Concern    Not on file   Social History Narrative    Not on file     Social Determinants of Health     Financial Resource Strain: Not on file   Food Insecurity: Not on file   Transportation Needs: Not on file   Physical Activity: Not on file   Stress: Not on file    Social Connections: Not on file   Intimate Partner Violence: Not on file   Housing Stability: Not on file       Family History   No family history on file.    Current Medications     Prior to Admission Medications   Prescriptions Last Dose Informant Patient Reported? Taking?   butalbital-acetaminophen-caffeine (FIORICET, ESGIC) 50-325-40 MG per tablet   No No   Sig: Take 1 tablet by mouth every 6 hours as needed for Headaches Max Daily Amount: 4 tablets   famotidine (PEPCID) 20 MG tablet   No No   Sig: Take 1 tablet by mouth 2 times daily   metoclopramide (REGLAN) 10 MG tablet   No No   Sig: Take 1 tablet by mouth 4 times daily as needed (nausea and/or headache)      Facility-Administered Medications: None       Allergies     Allergies   Allergen Reactions  Oxycodone-Acetaminophen Rash       Physical Exam     ED Triage Vitals [12/30/21 1904]   Enc Vitals Group      BP (!) 140/92      Pulse (!) 101      Respirations 20      Temp 98 F (36.7 C)      Temp Source Oral      SpO2 100 %      Weight - Scale 56.7 kg (125 lb)      Height 1.575 m (5\' 2" )      Head Circumference       Peak Flow       Pain Score       Pain Loc       Pain Edu?       Excl. in GC?      Physical Exam  Vitals reviewed.   Constitutional:       General: She is not in acute distress.     Appearance: Normal appearance. She is normal weight. She is not ill-appearing.   HENT:      Head: Normocephalic and atraumatic.      Mouth/Throat:      Mouth: Mucous membranes are moist.   Cardiovascular:      Rate and Rhythm: Normal rate and regular rhythm.      Pulses: Normal pulses.   Musculoskeletal:      Comments: Left anterior lower leg: 7 cm ulceration noted to left anterior shin with no drainage.  Small patch of necrotic tissue.  No warmth or redness noted.  Mild swelling.  Normal range of motion of ankle and knee.  Tenderness palpation of left shin.  DP and pedal pulses strong and equal.   Neurological:      Mental Status: She is alert.              Impression and Management Plan   36 year old female presents to the ED for left lower leg wound that she has had for 3 weeks she states is due to a spider bite.  She reports worsening pain and necrosis.  She finished a course of antibiotics with minimal relief of symptoms.  Patient also reports malodorous drainage.  She denies any fevers or chills.  On exam she has ulceration noted to left anterior shin with no drainage or odor with tenderness and mild swelling.  No increased warmth or redness with reviewing patient's previous picture from 12/10/2021 ED visit.  Patient vital signs within normal limits she is afebrile with normal pulse in the ED.  Blood work ordered prior to my evaluation shows normal white count and electrolytes.  Will place her on another course of antibiotics.  I do not believe that she needs IV antibiotics at this time.  Will also have patient follow-up with plastic surgery.  Patient is reporting pain is causing difficulty with walking.  Will place her in a walker boot for comfort with crutches and have her follow-up with plastic surgery.  Also advised patient to use compression stockings to an elevation to help with swelling.  Patient agrees with plan is ready be discharged.    Differential Diagnosis: Ddx is but not limited to: Leg ulcer, cellulitis, abscess    Procedures    Diagnostic Studies   Lab:   Labs Reviewed   CBC WITH AUTO DIFFERENTIAL - Abnormal; Notable for the following components:       Result Value    Hematocrit 34.9 (*)  Neutrophils Segmented 69.2 (*)     Lymphocytes 16.5 (*)     All other components within normal limits   COMPREHENSIVE METABOLIC PANEL - Abnormal; Notable for the following components:    Glucose 121 (*)     BUN 5 (*)     All other components within normal limits       Results for orders placed or performed during the hospital encounter of 12/30/21   CBC with Auto Differential   Result Value Ref Range    WBC 4.3 4.0 - 11.0 1000/mm3    RBC 3.85 3.60 - 5.20 M/uL     Hemoglobin 11.7 11.0 - 16.0 gm/dl    Hematocrit 69.6 (L) 35.0 - 47.0 %    MCV 90.6 80.0 - 98.0 fL    MCH 30.4 25.4 - 34.6 pg    MCHC 33.5 30.0 - 36.0 gm/dl    Platelets 789 381 - 450 1000/mm3    MPV 8.8 6.0 - 10.0 fL    RDW 44.5 36.4 - 46.3      Nucleated RBCs 0 0 - 0      Immature Granulocytes 0.2 0.0 - 3.0 %    Neutrophils Segmented 69.2 (H) 34 - 64 %    Lymphocytes 16.5 (L) 28 - 48 %    Monocytes 9.0 1 - 13 %    Eosinophils 3.9 0 - 5 %    Basophils 1.2 0 - 3 %   Comprehensive Metabolic Panel   Result Value Ref Range    Potassium 3.9 3.5 - 5.1 mEq/L    Chloride 106 98 - 107 mEq/L    Sodium 138 136 - 145 mEq/L    CO2 22 20 - 31 mEq/L    Glucose 121 (H) 74 - 106 mg/dl    BUN 5 (L) 9 - 23 mg/dl    Creatinine 0.17 5.10 - 1.02 mg/dl    GFR African American >60.0      GFR Non-African American >60      Calcium 9.3 8.7 - 10.4 mg/dl    Anion Gap 10 5 - 15 mmol/L    AST 24.0 0.0 - 33.9 U/L    ALT 10 10 - 49 U/L    Alkaline Phosphatase 69 46 - 116 U/L    Total Bilirubin 0.30 0.30 - 1.20 mg/dl    Total Protein 7.5 5.7 - 8.2 gm/dl    Albumin 3.8 3.4 - 5.0 gm/dl       Medical Decision Making/ ED Course         NARRATIVE: See impression management above    INTERNAL/EXTERNAL RECORDS REVIEWED: I reviewed the patient's previous records here at [facility] and available outside facilities and note that patient was seen in the ED 12/10/2021 for left lower leg cellulitis and vesicular wound.     Threat to body function without evaluation and management: Skin    Social Determinants impacting E&M: Access to care, health literacy    Final Diagnosis       ICD-10-CM    1. Wound of left leg, initial encounter  S81.802A       2. Left leg cellulitis  L03.116             Disposition     Disposition and plan:  Patient was discharged home in stable condition with discharge instructions on the same.     Return to the ER if condition worsens or new symptoms develop.   Follow up with primary care and plastic surgery as  discussed.        Medication List         START taking these medications      clindamycin 300 MG capsule  Commonly known as: CLEOCIN  Take 1 capsule by mouth 3 times daily for 7 days            ASK your doctor about these medications      butalbital-acetaminophen-caffeine 50-325-40 MG per tablet  Commonly known as: FIORICET, ESGIC  Take 1 tablet by mouth every 6 hours as needed for Headaches Max Daily Amount: 4 tablets     famotidine 20 MG tablet  Commonly known as: Pepcid  Take 1 tablet by mouth 2 times daily     metoclopramide 10 MG tablet  Commonly known as: Reglan  Take 1 tablet by mouth 4 times daily as needed (nausea and/or headache)               Where to Get Your Medications        These medications were sent to [facility] [place]  [address] [place], [place] [address]      Phone: [phone]   clindamycin 300 MG capsule           The patient was personally evaluated by myself and Dr. Meda Coffee who agrees with the above assessment and plan.    Dragon medical dictation software was used for portions of this report. Unintended errors may occur.     Westley Foots, PA-C  December 31, 2021    My signature above authenticates this document and my orders, the final    diagnosis (es), discharge prescription (s), and instructions in the Epic    record.  If you have any questions please contact [phone].     Nursing notes have been reviewed by the physician/ advanced practice    Clinician.       Westley Foots, [place]  12/31/21 [phone]

## 2021-12-31 ENCOUNTER — Inpatient Hospital Stay
Admit: 2021-12-31 | Discharge: 2021-12-31 | Disposition: A | Discharge status: Home / Self Care | Payer: MEDICAID | Attending: Emergency Medicine

## 2021-12-31 LAB — CBC WITH AUTO DIFFERENTIAL
Basophils: 1.2 % (ref 0–3)
Eosinophils: 3.9 % (ref 0–5)
Hematocrit: 34.9 % — ABNORMAL LOW (ref 35.0–47.0)
Hemoglobin: 11.7 gm/dl (ref 11.0–16.0)
Immature Granulocytes: 0.2 % (ref 0.0–3.0)
Lymphocytes: 16.5 % — ABNORMAL LOW (ref 28–48)
MCH: 30.4 pg (ref 25.4–34.6)
MCHC: 33.5 gm/dl (ref 30.0–36.0)
MCV: 90.6 fL (ref 80.0–98.0)
MPV: 8.8 fL (ref 6.0–10.0)
Monocytes: 9 % (ref 1–13)
Neutrophils Segmented: 69.2 % — ABNORMAL HIGH (ref 34–64)
Nucleated RBCs: 0 (ref 0–0)
Platelets: 222 10*3/uL (ref 140–450)
RBC: 3.85 M/uL (ref 3.60–5.20)
RDW: 44.5 (ref 36.4–46.3)
WBC: 4.3 10*3/uL (ref 4.0–11.0)

## 2021-12-31 LAB — COMPREHENSIVE METABOLIC PANEL
ALT: 10 U/L (ref 10–49)
AST: 24 U/L (ref 0.0–33.9)
Albumin: 3.8 gm/dl (ref 3.4–5.0)
Alkaline Phosphatase: 69 U/L (ref 46–116)
Anion Gap: 10 mmol/L (ref 5–15)
BUN: 5 mg/dl — ABNORMAL LOW (ref 9–23)
CO2: 22 mEq/L (ref 20–31)
Calcium: 9.3 mg/dl (ref 8.7–10.4)
Chloride: 106 mEq/L (ref 98–107)
Creatinine: 1 mg/dl (ref 0.55–1.02)
GFR African American: >60
GFR Non-African American: >60
Glucose: 121 mg/dl — ABNORMAL HIGH (ref 74–106)
Potassium: 3.9 mEq/L (ref 3.5–5.1)
Sodium: 138 mEq/L (ref 136–145)
Total Bilirubin: 0.3 mg/dl (ref 0.30–1.20)
Total Protein: 7.5 gm/dl (ref 5.7–8.2)

## 2021-12-31 MED ORDER — CLINDAMYCIN HCL 300 MG PO CAPS
300 MG | ORAL_CAPSULE | Freq: Three times a day (TID) | ORAL | 0 refills | Status: AC
Start: 2021-12-31 — End: 2022-01-07

## 2021-12-31 NOTE — ED Notes (Signed)
IV removed    Pt provided with DC paperwork     Merilyn Baba, RN  12/31/21 0109

## 2021-12-31 NOTE — Discharge Instructions (Addendum)
Take antibiotic as directed.  Use postop boot and crutches as needed for comfort.  Continue to take over-the-counter ibuprofen and Tylenol as needed for pain.  Keep leg elevated as much as possible and apply ice and use compression socks.  Schedule an appointment with plastic surgery for further wound care.  Return to the ED if symptoms worsen (increased fevers, redness, swelling, pain).

## 2021-12-31 NOTE — ED Notes (Signed)
Pt given short leg boot per orders. Pt was educated on how to remove and place boot on properly. Pt was wheeled to triage for discharge.      White, Donita L  12/31/21 0120

## 2023-02-18 ENCOUNTER — Inpatient Hospital Stay
Admit: 2023-02-18 | Discharge: 2023-02-19 | Disposition: A | Discharge status: Home / Self Care | Payer: MEDICAID | Attending: Emergency Medicine

## 2023-02-18 DIAGNOSIS — K0889 Other specified disorders of teeth and supporting structures: Secondary | ICD-10-CM

## 2023-02-18 DIAGNOSIS — K029 Dental caries, unspecified: Secondary | ICD-10-CM

## 2023-02-18 NOTE — ED Notes (Signed)
Pt medicated per MAR    Pt educated on what the meds are for    Pt denied any needs at this time        Nickola Major, LPN  54/09/81 1914

## 2023-02-18 NOTE — ED Triage Notes (Signed)
Patient arrives ambulatory through triage with c/o lower left dental pain since Tuesday. Patient reports she got a filling Tuesday but the pain has gotten worse and worse since then.

## 2023-02-18 NOTE — ED Notes (Signed)
8:10 PM  02/18/23    Discharge instructions given to Alejandra Carpenter with verbalization of understanding.  Alejandra Carpenter accompanied by SELF.  Alejandra Carpenter discharged with the following prescriptions      Medication List        START taking these medications      HYDROcodone-acetaminophen 7.5-325 MG per tablet  Commonly known as: Norco  Take 1 tablet by mouth every 6 hours as needed for Pain for up to 3 days. Intended supply: 30 days Max Daily Amount: 4 tablets     ibuprofen 800 MG tablet  Commonly known as: ADVIL;MOTRIN  Take 1 tablet by mouth every 8 hours as needed for Pain     penicillin v potassium 500 MG tablet  Commonly known as: VEETID  Take 1 tablet by mouth 4 times daily for 10 days            ASK your doctor about these medications      butalbital-acetaminophen-caffeine 50-325-40 MG per tablet  Commonly known as: FIORICET, ESGIC  Take 1 tablet by mouth every 6 hours as needed for Headaches Max Daily Amount: 4 tablets     famotidine 20 MG tablet  Commonly known as: Pepcid  Take 1 tablet by mouth 2 times daily     metoclopramide 10 MG tablet  Commonly known as: Reglan  Take 1 tablet by mouth 4 times daily as needed (nausea and/or headache)               Where to Get Your Medications        These medications were sent to [facility] [place], [place] - [address] [place] [phone] [facility]on Gins[facility] [phone]  [address] [place], [place] [address]      Phone: [phone]   HYDROcodone-acetaminophen 7.5-325 MG per tablet  ibuprofen 800 MG tablet  penicillin v potassium 500 MG tablet     .  Alejandra Carpenter discharged to HOME IN STABLE CONDITION.    Nickola Major, LPN      Nickola Major, LPN  96/29/52 8413

## 2023-02-18 NOTE — ED Provider Notes (Signed)
[facility]  Emergency Department Treatment Report        Patient: Alejandra Carpenter Age: 38 y.o. Sex: female    Date of Birth: 05-14-1985 Admit Date: 02/18/2023 PCP: Mendel Corning, MD   MRN: [id number]  CSN: [id number]  Konrad Felix, MD   Room: ER05/ER05 Time Dictated: 8:20 PM            Chief Complaint   Chief Complaint   Patient presents with    Dental Pain              History of Present Illness   This is a 38 y.o. female left dental pain, filling, told she might need a root canal. Tuesday.  Denies fever, states has tried everything without relief since Tuesday as soon as the dental block wore off she is having significant pain to that tooth.      Review of Systems   Review of Systems    See HPI  Past Medical/Surgical History   No past medical history on file.  No past surgical history on file.  See HPI  Social History     Social History     Socioeconomic History    Marital status: Married     Spouse name: Not on file    Number of children: Not on file    Years of education: Not on file    Highest education level: Not on file   Occupational History    Not on file   Tobacco Use    Smoking status: Not on file    Smokeless tobacco: Not on file   Substance and Sexual Activity    Alcohol use: Not on file    Drug use: Not on file    Sexual activity: Not on file   Other Topics Concern    Not on file   Social History Narrative    Not on file     Social Determinants of Health     Financial Resource Strain: Not on file   Food Insecurity: Not on file   Transportation Needs: Not on file   Physical Activity: Not on file   Stress: Not on file   Social Connections: Not on file   Intimate Partner Violence: Not on file   Housing Stability: Not on file     See HPI if pertinent  Family History   No family history on file.  See HPI if pertinent  Current Medications     No current facility-administered medications for this encounter.     Current Outpatient Medications   Medication Sig Dispense Refill    ibuprofen  (ADVIL;MOTRIN) 800 MG tablet Take 1 tablet by mouth every 8 hours as needed for Pain 60 tablet 0    penicillin v potassium (VEETID) 500 MG tablet Take 1 tablet by mouth 4 times daily for 10 days 40 tablet 0    HYDROcodone-acetaminophen (NORCO) 7.5-325 MG per tablet Take 1 tablet by mouth every 6 hours as needed for Pain for up to 3 days. Intended supply: 30 days Max Daily Amount: 4 tablets 12 tablet 0    famotidine (PEPCID) 20 MG tablet Take 1 tablet by mouth 2 times daily 60 tablet 3    metoclopramide (REGLAN) 10 MG tablet Take 1 tablet by mouth 4 times daily as needed (nausea and/or headache) 20 tablet 3    butalbital-acetaminophen-caffeine (FIORICET, ESGIC) 50-325-40 MG per tablet Take 1 tablet by mouth every 6 hours as needed for Headaches Max Daily  Amount: 4 tablets 20 tablet 0       Allergies     Allergies   Allergen Reactions    Oxycodone-Acetaminophen Rash       Physical Exam   Patient Vitals for the past 24 hrs:   Temp Pulse Resp BP SpO2   02/18/23 1848 98.4 F (36.9 C) 84 16 (!) 170/121 100 %     Physical Exam  HENT:      Mouth/Throat:        Comments: Patient indicates she has pain to the area marked in purple above.  There is no surrounding gingival erythema or fluctuance there is no purulence.  There is slight bruising around the tooth.  The tooth is sensitive to light touch.  There is no external jaw erythema or swelling there is no stridor or trismus.          Impression and Management Plan   38 year old female complaining of severe dental pain to a tooth she had a filling placed on Tuesday.  Unable to contact her dentist today.  Differential includes dentalgia due to dental injury, injury to this nerve, dental caries, dental infection abscess, osteomyelitis      Diagnostic Studies   No results found for this or any previous visit (from the past 24 hour(s)).   No orders to display           ED Course         Dental Nerve Block    Date/Time: 02/18/2023 8:17 PM    Performed by: Susanne Borders,  PA-C  Authorized by: Konrad Felix, MD    Consent:     Consent obtained:  Verbal    Consent given by:  Patient    Risks, benefits, and alternatives were discussed: yes      Risks discussed:  Allergic reaction, infection, intravascular injection, hematoma, nerve damage, pain, swelling and unsuccessful block  Universal protocol:     Procedure explained and questions answered to patient or proxy's satisfaction: yes      Relevant documents present and verified: yes      Test results available: yes      Imaging studies available: yes      Required blood products, implants, devices, and special equipment available: yes      Site/side marked: yes      Immediately prior to procedure, a time out was called: yes      Patient identity confirmed:  Verbally with patient and arm band  Indications:     Indications: post-dental procedure pain    Location:     Block type:  Supraperiosteal    Supraperiosteal location:  Lower teeth    Lower teeth location:  19/LL 1st molar  Procedure details:     Topical anesthesia: Marcaine.    Syringe type:  Luer lock syringe    Needle gauge:  27 G    Block anesthetic: Marcaine.    Injection procedure:  Anatomic landmarks identified, introduced needle, negative aspiration for blood, incremental injection and anatomic landmarks palpated  Post-procedure details:     Outcome:  Pain improved    Procedure completion:  Tolerated well, no immediate complications        Threat to body function without evaluation and management: Dentalgia      SOCIAL DETERMINANTS  impacting Evaluation and Management: Health literacy            Medications   ketorolac (TORADOL) injection 30 mg (30 mg IntraMUSCular Given 02/18/23 2005)  penicillin v potassium (VEETID) tablet 500 mg (500 mg Oral Given 02/18/23 2005)   HYDROcodone-acetaminophen (NORCO) 10-325 MG per tablet 1 tablet (1 tablet Oral Given 02/18/23 2005)               Medical Decision Making     38 year old female with dental pain following dental procedure.  It  does not appear to be infected, patient was told she may need a root canal, the feeling is intact on my evaluation.  I performed a supraperiosteal block with partial relief of pain.  She is given Toradol, initial dose of penicillin, and Norco to go home with.  She is prescribed a small amount of Norco as well as penicillin and ibuprofen, I have given her a list of dental resources and strongly encouraged her to follow-up with her dentist in the morning.  She is discharged in stable ambulatory condition.    Final Diagnosis       ICD-10-CM    1. Pain, dental  K08.89 HYDROcodone-acetaminophen (NORCO) 7.5-325 MG per tablet      2. Pain due to dental caries  K02.9            Disposition   Discharge    The patient was personally evaluated by myself and discussed with Konrad Felix, MD who agrees with the above assessment and plan.  Brett Canales, PA-C    February 18, 2023    My signature above authenticates this document and my orders, the final    diagnosis (es), discharge prescription (s), and instructions in the Epic    record.  If you have any questions please contact [phone].     Nursing notes have been reviewed by the physician/ advanced practice    Clinician.                             Oluwaseun Bruyere, Angus Seller, PA-C  02/18/23 2020

## 2023-02-18 NOTE — Discharge Instructions (Addendum)
Take the antibiotic as prescribed as per discussion, take ibuprofen as prescribed, and you can take 1 additional 500 mg Tylenol with the Norco that I prescribed.  Norco is a narcotic pain medication, do not drink alcohol drive while taking this medication or combine with other medications that may make you sleepy.      Follow-up with one of the provided dental clinics below:    [facility] [phone]  [facility] [phone]  [facility] [phone]  [facility] [phone]  [facility] [phone]

## 2023-02-19 MED ORDER — PENICILLIN V POTASSIUM 500 MG PO TABS
500 | ORAL_TABLET | Freq: Four times a day (QID) | ORAL | 0 refills | Status: AC
Start: 2023-02-19 — End: 2023-02-28

## 2023-02-19 MED ORDER — PENICILLIN V POTASSIUM 250 MG PO TABS
250 | ORAL | Status: AC
Start: 2023-02-19 — End: 2023-02-18
  Administered 2023-02-19: 01:00:00 500 mg via ORAL

## 2023-02-19 MED ORDER — IBUPROFEN 800 MG PO TABS
800 | ORAL_TABLET | Freq: Three times a day (TID) | ORAL | 0 refills | Status: AC | PRN
Start: 2023-02-19 — End: ?

## 2023-02-19 MED ORDER — KETOROLAC TROMETHAMINE 30 MG/ML IJ SOLN
30 | INTRAMUSCULAR | Status: AC
Start: 2023-02-19 — End: 2023-02-18
  Administered 2023-02-19: 01:00:00 30 mg via INTRAMUSCULAR

## 2023-02-19 MED ORDER — HYDROCODONE-ACETAMINOPHEN 7.5-325 MG PO TABS
7.5-325 | ORAL_TABLET | Freq: Four times a day (QID) | ORAL | 0 refills | Status: AC | PRN
Start: 2023-02-19 — End: 2023-02-21

## 2023-02-19 MED ORDER — HYDROCODONE-ACETAMINOPHEN 10-325 MG PO TABS
10-325 | ORAL | Status: AC
Start: 2023-02-19 — End: 2023-02-18
  Administered 2023-02-19: 01:00:00 1 via ORAL

## 2023-02-19 MED FILL — KETOROLAC TROMETHAMINE 30 MG/ML IJ SOLN: 30 MG/ML | INTRAMUSCULAR | Qty: 1

## 2023-02-19 MED FILL — PENICILLIN V POTASSIUM 250 MG PO TABS: 250 MG | ORAL | Qty: 2

## 2023-02-19 MED FILL — HYDROCODONE-ACETAMINOPHEN 10-325 MG PO TABS: 10-325 MG | ORAL | Qty: 1
